# Patient Record
Sex: Female | Born: 1970 | Hispanic: No | Marital: Married
Health system: Southern US, Community
[De-identification: ages and names within clinical notes are randomized; demographics above are authoritative.]

## PROBLEM LIST (undated history)

## (undated) DIAGNOSIS — C140 Malignant neoplasm of pharynx, unspecified: Secondary | ICD-10-CM

## (undated) DIAGNOSIS — I10 Essential (primary) hypertension: Secondary | ICD-10-CM

## (undated) HISTORY — DX: Malignant neoplasm of pharynx, unspecified: C14.0

## (undated) HISTORY — DX: Essential (primary) hypertension: I10

---

## 2007-01-21 ENCOUNTER — Ambulatory Visit (HOSPITAL_COMMUNITY): Admission: RE | Admit: 2007-01-21 | Discharge: 2007-01-21 | Payer: Self-pay | Admitting: Obstetrics and Gynecology

## 2007-02-18 ENCOUNTER — Ambulatory Visit (HOSPITAL_COMMUNITY): Admission: RE | Admit: 2007-02-18 | Discharge: 2007-02-18 | Payer: Self-pay | Admitting: Obstetrics and Gynecology

## 2007-03-20 ENCOUNTER — Ambulatory Visit (HOSPITAL_COMMUNITY): Admission: RE | Admit: 2007-03-20 | Discharge: 2007-03-20 | Payer: Self-pay | Admitting: Obstetrics and Gynecology

## 2017-06-15 ENCOUNTER — Encounter: Payer: Self-pay | Admitting: Neurology

## 2017-06-15 ENCOUNTER — Telehealth: Payer: Self-pay | Admitting: Neurology

## 2017-06-15 NOTE — Telephone Encounter (Signed)
Called pt to r/s NCV / EMG.  LVM to r/s. R/s letter has been sent.

## 2017-07-11 ENCOUNTER — Encounter: Payer: Medicaid Other | Admitting: Neurology

## 2017-07-26 ENCOUNTER — Encounter (INDEPENDENT_AMBULATORY_CARE_PROVIDER_SITE_OTHER): Payer: Medicaid Other | Admitting: Diagnostic Neuroimaging

## 2017-07-26 ENCOUNTER — Ambulatory Visit (INDEPENDENT_AMBULATORY_CARE_PROVIDER_SITE_OTHER): Payer: Medicaid Other | Admitting: Diagnostic Neuroimaging

## 2017-07-26 DIAGNOSIS — R200 Anesthesia of skin: Secondary | ICD-10-CM | POA: Diagnosis not present

## 2017-07-26 DIAGNOSIS — R202 Paresthesia of skin: Secondary | ICD-10-CM

## 2017-07-26 DIAGNOSIS — Z0289 Encounter for other administrative examinations: Secondary | ICD-10-CM

## 2017-07-27 NOTE — Procedures (Signed)
   [facility]  NCS (NERVE CONDUCTION STUDY) WITH EMG (ELECTROMYOGRAPHY) REPORT   STUDY DATE: 07/26/17 PATIENT NAME: Tamara Stevens DOB: 10-25-1970 MRN: [id number]  ORDERING CLINICIAN: Windell Hummingbird, PA-c  TECHNOLOGIST: Oneita Jolly  ELECTROMYOGRAPHER: Earlean Polka. Penumalli, MD  CLINICAL INFORMATION: 47 year old female here for evaluation of left hand numbness.  FINDINGS: NERVE CONDUCTION STUDY:  Left median motor response is prolonged distal latency (7.1 ms) normal amplitude and normal conduction velocity.  Left ulnar motor response is normal.  Left ulnar F wave latency is normal.  Left median sensory response has prolonged peak latency and decreased amplitude.  Left ulnar sensory response normal.   NEEDLE ELECTROMYOGRAPHY:  Needle examination of left upper extremity flexor carpal radialis, first dorsal interosseous and triceps is normal.    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    INTERPRETING PHYSICIAN:  Penni Bombard, MD Certified in Neurology, Neurophysiology and Neuroimaging  [facility] [address], [place], [place] [address] [phone]   [facility]    Nerve / Sites Muscle Latency Ref. Amplitude Ref. Rel Amp Segments Distance Velocity Ref. Area    ms ms mV mV %  cm m/s m/s mVms  L Median - APB     Wrist APB 7.1 ?4.4 5.1 ?4.0 100 Wrist - APB 7   21.5     Upper arm APB 10.6  5.0  99.3 Upper arm - Wrist 20 58 ?49 22.5  L Ulnar - ADM     Wrist ADM 2.6 ?3.3 7.0 ?6.0 100 Wrist - ADM 7   20.7     B.Elbow ADM 6.1  5.8  83.2 B.Elbow - Wrist 20 56 ?49 18.4     A.Elbow ADM 7.8  5.7  98.4 A.Elbow - B.Elbow 10 58 ?49 19.3         A.Elbow - Wrist             SNC    Nerve / Sites Rec. Site Peak Lat Ref.  Amp Ref. Segments Distance    ms ms V V  cm  L Median - Orthodromic (Dig II, Mid palm)     Dig II Wrist 5.6 ?3.4  4 ?10 Dig II - Wrist 13  L Ulnar - Orthodromic, (Dig V, Mid palm)     Dig V Wrist 2.5 ?3.1 17 ?5 Dig V - Wrist 62         F  Wave    Nerve F Lat Ref.   ms ms  L Ulnar - ADM 27.1 ?32.0       EMG full       EMG Summary Table    Spontaneous MUAP Recruitment  Muscle IA Fib PSW Fasc Other Amp Dur. Poly Pattern  L. Flexor carpi radialis Normal None None None _______ Normal Normal Normal Normal  L. First dorsal interosseous Normal None None None _______ Normal Normal Normal Normal  L. Triceps brachii Normal None None None _______ Normal Normal Normal Normal

## 2018-02-06 HISTORY — PX: THROAT SURGERY: SHX803

## 2019-07-18 ENCOUNTER — Other Ambulatory Visit (HOSPITAL_COMMUNITY): Payer: Self-pay | Admitting: Endocrinology

## 2019-07-18 DIAGNOSIS — C73 Malignant neoplasm of thyroid gland: Secondary | ICD-10-CM

## 2019-07-30 ENCOUNTER — Ambulatory Visit (HOSPITAL_COMMUNITY)
Admission: RE | Admit: 2019-07-30 | Discharge: 2019-07-30 | Disposition: A | Discharge status: Home / Self Care | Payer: Medicaid Other | Source: Ambulatory Visit | Attending: Endocrinology | Admitting: Endocrinology

## 2019-07-30 ENCOUNTER — Other Ambulatory Visit: Payer: Self-pay

## 2019-07-30 DIAGNOSIS — C73 Malignant neoplasm of thyroid gland: Secondary | ICD-10-CM

## 2019-07-30 MED ORDER — THYROTROPIN ALFA 1.1 MG IM SOLR
INTRAMUSCULAR | Status: AC
  Administered 2019-07-30: 0.9 mg via INTRAMUSCULAR
  Filled 2019-07-30: qty 0.9

## 2019-07-30 MED ORDER — THYROTROPIN ALFA 1.1 MG IM SOLR: 0.9000 mg | INTRAMUSCULAR | Status: AC

## 2019-07-31 ENCOUNTER — Encounter (HOSPITAL_COMMUNITY)
Admission: RE | Admit: 2019-07-31 | Discharge: 2019-07-31 | Disposition: A | Discharge status: Home / Self Care | Payer: Medicaid Other | Source: Ambulatory Visit | Attending: Endocrinology | Admitting: Endocrinology

## 2019-07-31 ENCOUNTER — Other Ambulatory Visit (HOSPITAL_COMMUNITY): Payer: Self-pay | Admitting: Diagnostic Radiology

## 2019-07-31 DIAGNOSIS — C73 Malignant neoplasm of thyroid gland: Secondary | ICD-10-CM | POA: Diagnosis not present

## 2019-07-31 MED ORDER — THYROTROPIN ALFA 1.1 MG IM SOLR
INTRAMUSCULAR | Status: AC
  Filled 2019-07-31: qty 0.9

## 2019-08-01 ENCOUNTER — Encounter (HOSPITAL_COMMUNITY)
Admission: RE | Admit: 2019-08-01 | Discharge: 2019-08-01 | Disposition: A | Discharge status: Home / Self Care | Payer: Medicaid Other | Source: Ambulatory Visit | Attending: Endocrinology | Admitting: Endocrinology

## 2019-08-01 ENCOUNTER — Other Ambulatory Visit: Payer: Self-pay

## 2019-08-01 ENCOUNTER — Other Ambulatory Visit (HOSPITAL_COMMUNITY): Payer: Self-pay | Admitting: Endocrinology

## 2019-08-01 DIAGNOSIS — C73 Malignant neoplasm of thyroid gland: Secondary | ICD-10-CM | POA: Diagnosis not present

## 2019-08-01 LAB — PREGNANCY, URINE: Preg Test, Ur: NEGATIVE

## 2019-08-01 MED ORDER — SODIUM IODIDE I 131 CAPSULE
124.1000 | Freq: Once | INTRAVENOUS | Status: AC | PRN
  Administered 2019-08-01: 124.1 via ORAL

## 2019-08-06 MED ORDER — THYROTROPIN ALFA 1.1 MG IM SOLR
0.9000 mg | Freq: Once | INTRAMUSCULAR | Status: AC
  Administered 2019-07-31: 0.9 mg via INTRAMUSCULAR

## 2019-08-12 ENCOUNTER — Encounter (HOSPITAL_COMMUNITY)
Admission: RE | Admit: 2019-08-12 | Discharge: 2019-08-12 | Disposition: A | Discharge status: Home / Self Care | Payer: Medicaid Other | Source: Ambulatory Visit | Attending: Endocrinology | Admitting: Endocrinology

## 2019-08-12 ENCOUNTER — Other Ambulatory Visit: Payer: Self-pay

## 2019-08-12 ENCOUNTER — Other Ambulatory Visit (HOSPITAL_COMMUNITY): Payer: Medicaid Other

## 2019-08-12 DIAGNOSIS — C73 Malignant neoplasm of thyroid gland: Secondary | ICD-10-CM | POA: Diagnosis present

## 2021-03-19 DIAGNOSIS — R070 Pain in throat: Secondary | ICD-10-CM | POA: Insufficient documentation

## 2021-04-09 ENCOUNTER — Ambulatory Visit: Payer: Self-pay | Admitting: Adult Health
